# Patient Record
Sex: Female | Born: 1960
Health system: Southern US, Community
[De-identification: ages and names within clinical notes are randomized; demographics above are authoritative.]

## PROBLEM LIST (undated history)

## (undated) DIAGNOSIS — E669 Obesity, unspecified: Secondary | ICD-10-CM

## (undated) DIAGNOSIS — J309 Allergic rhinitis, unspecified: Secondary | ICD-10-CM

## (undated) DIAGNOSIS — E559 Vitamin D deficiency, unspecified: Secondary | ICD-10-CM

## (undated) HISTORY — PX: ABDOMINAL HYSTERECTOMY: SHX81

## (undated) HISTORY — DX: Obesity, unspecified: E66.9

## (undated) HISTORY — DX: Allergic rhinitis, unspecified: J30.9

## (undated) HISTORY — DX: Vitamin D deficiency, unspecified: E55.9

---

## 1997-10-14 ENCOUNTER — Ambulatory Visit (HOSPITAL_COMMUNITY): Admission: RE | Admit: 1997-10-14 | Discharge: 1997-10-14 | Payer: Self-pay | Admitting: Internal Medicine

## 2002-02-09 ENCOUNTER — Encounter: Payer: Self-pay | Admitting: Internal Medicine

## 2002-02-09 ENCOUNTER — Encounter: Admission: RE | Admit: 2002-02-09 | Discharge: 2002-02-09 | Payer: Self-pay | Admitting: Internal Medicine

## 2002-09-21 ENCOUNTER — Encounter (INDEPENDENT_AMBULATORY_CARE_PROVIDER_SITE_OTHER): Payer: Self-pay | Admitting: Cardiovascular Disease

## 2002-09-21 ENCOUNTER — Ambulatory Visit (HOSPITAL_COMMUNITY): Admission: RE | Admit: 2002-09-21 | Discharge: 2002-09-21 | Payer: Self-pay | Admitting: Internal Medicine

## 2003-07-01 ENCOUNTER — Other Ambulatory Visit: Admission: RE | Admit: 2003-07-01 | Discharge: 2003-07-01 | Payer: Self-pay | Admitting: Internal Medicine

## 2005-07-05 ENCOUNTER — Other Ambulatory Visit: Admission: RE | Admit: 2005-07-05 | Discharge: 2005-07-05 | Payer: Self-pay | Admitting: Internal Medicine

## 2006-08-19 ENCOUNTER — Emergency Department (HOSPITAL_COMMUNITY): Admission: EM | Admit: 2006-08-19 | Discharge: 2006-08-19 | Payer: Self-pay | Admitting: Family Medicine

## 2009-06-08 ENCOUNTER — Emergency Department (HOSPITAL_COMMUNITY): Admission: EM | Admit: 2009-06-08 | Discharge: 2009-06-08 | Payer: Self-pay | Admitting: Emergency Medicine

## 2009-07-15 ENCOUNTER — Other Ambulatory Visit: Admission: RE | Admit: 2009-07-15 | Discharge: 2009-07-15 | Payer: Self-pay | Admitting: Internal Medicine

## 2009-07-18 ENCOUNTER — Encounter: Admission: RE | Admit: 2009-07-18 | Discharge: 2009-07-18 | Payer: Self-pay | Admitting: Internal Medicine

## 2010-01-26 ENCOUNTER — Encounter: Payer: Self-pay | Admitting: Internal Medicine

## 2010-06-15 ENCOUNTER — Other Ambulatory Visit: Payer: Self-pay | Admitting: Internal Medicine

## 2010-06-15 DIAGNOSIS — Z1231 Encounter for screening mammogram for malignant neoplasm of breast: Secondary | ICD-10-CM

## 2010-08-11 ENCOUNTER — Ambulatory Visit: Payer: Self-pay

## 2011-04-18 ENCOUNTER — Ambulatory Visit: Payer: BC Managed Care – PPO

## 2011-06-22 ENCOUNTER — Other Ambulatory Visit: Payer: Self-pay | Admitting: Internal Medicine

## 2011-06-22 DIAGNOSIS — Z1231 Encounter for screening mammogram for malignant neoplasm of breast: Secondary | ICD-10-CM

## 2011-07-06 ENCOUNTER — Ambulatory Visit
Admission: RE | Admit: 2011-07-06 | Discharge: 2011-07-06 | Disposition: A | Discharge status: Home / Self Care | Payer: 59 | Source: Ambulatory Visit | Attending: Internal Medicine | Admitting: Internal Medicine

## 2011-07-06 DIAGNOSIS — Z1231 Encounter for screening mammogram for malignant neoplasm of breast: Secondary | ICD-10-CM

## 2011-07-06 LAB — HM MAMMOGRAPHY

## 2011-07-13 LAB — BASIC METABOLIC PANEL
BUN: 16 mg/dL (ref 4–21)
Creatinine: 0.8 mg/dL (ref 0.5–1.1)
Glucose: 88 mg/dL

## 2011-07-13 LAB — HEPATIC FUNCTION PANEL
ALT: 11 U/L (ref 7–35)
AST: 15 U/L (ref 13–35)
Alkaline Phosphatase: 67 U/L (ref 25–125)
Bilirubin, Total: 0.4 mg/dL

## 2011-07-13 LAB — CBC AND DIFFERENTIAL: WBC: 5 10^3/mL

## 2011-07-13 LAB — HEMOGLOBIN A1C: Hgb A1c MFr Bld: 5.8 % (ref 4.0–6.0)

## 2011-07-13 LAB — LIPID PANEL: LDl/HDL Ratio: 3.3

## 2011-07-14 ENCOUNTER — Other Ambulatory Visit (HOSPITAL_COMMUNITY)
Admission: RE | Admit: 2011-07-14 | Discharge: 2011-07-14 | Disposition: A | Discharge status: Home / Self Care | Payer: 59 | Source: Ambulatory Visit | Attending: Internal Medicine | Admitting: Internal Medicine

## 2011-07-14 ENCOUNTER — Other Ambulatory Visit: Payer: Self-pay | Admitting: Internal Medicine

## 2011-07-14 DIAGNOSIS — Z01419 Encounter for gynecological examination (general) (routine) without abnormal findings: Secondary | ICD-10-CM | POA: Insufficient documentation

## 2012-03-08 ENCOUNTER — Encounter: Payer: Self-pay | Admitting: Hematology

## 2013-05-03 ENCOUNTER — Emergency Department (HOSPITAL_COMMUNITY): Payer: PRIVATE HEALTH INSURANCE

## 2013-05-03 ENCOUNTER — Emergency Department (HOSPITAL_COMMUNITY)
Admission: EM | Admit: 2013-05-03 | Discharge: 2013-05-03 | Disposition: A | Discharge status: Home / Self Care | Payer: PRIVATE HEALTH INSURANCE | Attending: Emergency Medicine | Admitting: Emergency Medicine

## 2013-05-03 ENCOUNTER — Encounter (HOSPITAL_COMMUNITY): Payer: Self-pay | Admitting: Emergency Medicine

## 2013-05-03 DIAGNOSIS — Y990 Civilian activity done for income or pay: Secondary | ICD-10-CM | POA: Insufficient documentation

## 2013-05-03 DIAGNOSIS — E669 Obesity, unspecified: Secondary | ICD-10-CM | POA: Insufficient documentation

## 2013-05-03 DIAGNOSIS — W010XXA Fall on same level from slipping, tripping and stumbling without subsequent striking against object, initial encounter: Secondary | ICD-10-CM | POA: Insufficient documentation

## 2013-05-03 DIAGNOSIS — E559 Vitamin D deficiency, unspecified: Secondary | ICD-10-CM | POA: Insufficient documentation

## 2013-05-03 DIAGNOSIS — X500XXA Overexertion from strenuous movement or load, initial encounter: Secondary | ICD-10-CM | POA: Insufficient documentation

## 2013-05-03 DIAGNOSIS — Y9289 Other specified places as the place of occurrence of the external cause: Secondary | ICD-10-CM | POA: Insufficient documentation

## 2013-05-03 DIAGNOSIS — S86912A Strain of unspecified muscle(s) and tendon(s) at lower leg level, left leg, initial encounter: Secondary | ICD-10-CM

## 2013-05-03 DIAGNOSIS — IMO0002 Reserved for concepts with insufficient information to code with codable children: Secondary | ICD-10-CM | POA: Insufficient documentation

## 2013-05-03 DIAGNOSIS — Z79899 Other long term (current) drug therapy: Secondary | ICD-10-CM | POA: Insufficient documentation

## 2013-05-03 DIAGNOSIS — Y9389 Activity, other specified: Secondary | ICD-10-CM | POA: Insufficient documentation

## 2013-05-03 MED ORDER — NAPROXEN 500 MG PO TABS: 500.0000 mg | ORAL_TABLET | Freq: Two times a day (BID) | ORAL | Status: AC

## 2013-05-03 MED ORDER — NAPROXEN 250 MG PO TABS
500.0000 mg | ORAL_TABLET | Freq: Once | ORAL | Status: DC
  Filled 2013-05-03: qty 2

## 2013-05-03 NOTE — ED Notes (Signed)
Patient fell while at at work.  Slipped on the floor thinking there may have been some ice on the floor

## 2013-05-03 NOTE — ED Provider Notes (Signed)
CSN: [id number]     Arrival date & time 05/03/13  0108 History   First MD Initiated Contact with Patient 05/03/13 0252     Chief Complaint  Patient presents with  . Fall     (Consider location/radiation/quality/duration/timing/severity/associated sxs/prior Treatment) HPI Comments: 53 year old female, presents shortly after injuring her left knee when she slipped on a wet floor and fell to the ground. She states that she twisted her knee, felt acute onset of pain which has been persistent, worse with ambulating but not associated with swelling redness or any other injuries.  Patient is a 53 y.o. female presenting with fall. The history is provided by the patient.  Fall    Past Medical History  Diagnosis Date  . Mild obesity   . Allergic rhinitis   . Vitamin D deficiency    Past Surgical History  Procedure Laterality Date  . Abdominal hysterectomy     History reviewed. No pertinent family history. History  Substance Use Topics  . Smoking status: Never Smoker   . Smokeless tobacco: Never Used  . Alcohol Use: No   OB History   Grav Para Term Preterm Abortions TAB SAB Ect Mult Living                 Review of Systems  All other systems reviewed and are negative.     Allergies  Codeine  Home Medications   Prior to Admission medications   Medication Sig Start Date End Date Taking? Authorizing Provider  cholecalciferol (VITAMIN D) 1000 UNITS tablet Take 1,000 Units by mouth daily.    Historical Provider, MD  fish oil-omega-3 fatty acids 1000 MG capsule Take 1 g by mouth daily.    Historical Provider, MD   BP 141/75  Pulse 73  Temp(Src) 98 F (36.7 C) (Oral)  Resp 18  Ht 5\' 4"  (1.626 m)  Wt 160 lb (72.576 kg)  BMI 27.45 kg/m2  SpO2 100% Physical Exam  Nursing note and vitals reviewed. Constitutional: She appears well-developed and well-nourished. No distress.  HENT:  Head: Normocephalic and atraumatic.  Mouth/Throat: Oropharynx is clear and moist. No  oropharyngeal exudate.  Eyes: Conjunctivae and EOM are normal. Pupils are equal, round, and reactive to light. Right eye exhibits no discharge. Left eye exhibits no discharge. No scleral icterus.  Neck: Normal range of motion. Neck supple. No JVD present. No thyromegaly present.  Cardiovascular: Normal rate and intact distal pulses.   Pulmonary/Chest: Effort normal. No respiratory distress.  Musculoskeletal: Normal range of motion. She exhibits tenderness ( Mild tenderness with flexion of the left knee, stable joint anterior posterior lateral and medial, no tenderness over the joint capsule or the proximal tibia, no pain with manipulation of the patella). She exhibits no edema.  Lymphadenopathy:    She has no cervical adenopathy.  Neurological: She is alert. Coordination normal.  Skin: Skin is warm and dry. No rash noted. No erythema.  Psychiatric: She has a normal mood and affect. Her behavior is normal.    ED Course  Procedures (including critical care time) Labs Review Labs Reviewed - No data to display  Imaging Review Dg Knee Complete 4 Views Left  05/03/2013   CLINICAL DATA:  Pain and swelling anteriorly after following and striking the left knee  EXAM: LEFT KNEE - COMPLETE 4+ VIEW  COMPARISON:  None.  FINDINGS: Four views of the left knee reveal the bones to be adequately mineralized. There is no evidence of an acute fracture nor dislocation. There is minimal beaking  of the tibial spines. There may be a small joint effusion. There is soft tissue fullness in the prepatellar region.  IMPRESSION: There is no acute bony abnormality of the left knee. There is mild soft tissue swelling and likely a small joint effusion.   Electronically Signed   By: David  [place]   On: 05/03/2013 01:59      MDM   Final diagnoses:  None    The patient has normal sensation, normal motor, she can straight leg raise without difficulty but has pain with flexion, x-rays reviewed and show no signs of  fracture, knee immobilizer, Rice therapy, home.   Meds given in ED:  Medications  naproxen (NAPROSYN) tablet 500 mg (not administered)    New Prescriptions   NAPROXEN (NAPROSYN) 500 MG TABLET    Take 1 tablet (500 mg total) by mouth 2 (two) times daily with a meal.        Vida RollerBrian D Daran Favaro, MD 05/03/13 [phone]

## 2013-05-03 NOTE — Discharge Instructions (Signed)
Please call your doctor for a followup appointment within 24-48 hours. When you talk to your doctor please let them know that you were seen in the emergency department and have them acquire all of your records so that they can discuss the findings with you and formulate a treatment plan to fully care for your new and ongoing problems.  xrays show no fractures

## 2013-07-24 ENCOUNTER — Other Ambulatory Visit: Payer: Self-pay

## 2013-07-24 DIAGNOSIS — Z1231 Encounter for screening mammogram for malignant neoplasm of breast: Secondary | ICD-10-CM

## 2013-07-26 ENCOUNTER — Ambulatory Visit
Admission: RE | Admit: 2013-07-26 | Discharge: 2013-07-26 | Disposition: A | Discharge status: Home / Self Care | Payer: 59 | Source: Ambulatory Visit

## 2013-07-26 DIAGNOSIS — Z1231 Encounter for screening mammogram for malignant neoplasm of breast: Secondary | ICD-10-CM

## 2013-08-02 ENCOUNTER — Ambulatory Visit: Payer: Self-pay

## 2013-11-10 ENCOUNTER — Encounter: Payer: 59 | Attending: Internal Medicine | Admitting: Skilled Nursing Facility1

## 2013-11-10 DIAGNOSIS — E669 Obesity, unspecified: Secondary | ICD-10-CM | POA: Insufficient documentation

## 2013-11-10 DIAGNOSIS — Z713 Dietary counseling and surveillance: Secondary | ICD-10-CM | POA: Insufficient documentation

## 2013-11-10 DIAGNOSIS — Z6827 Body mass index (BMI) 27.0-27.9, adult: Secondary | ICD-10-CM | POA: Diagnosis not present

## 2013-11-12 NOTE — Progress Notes (Deleted)
Subjective:     Patient ID: Amy Fields, female   DOB: 01/23/1960, 53 y.o.   MRN: [id number]  HPI   Review of Systems     Objective:   Physical Exam     Assessment:     ***    Plan:     ***      

## 2013-11-15 NOTE — Progress Notes (Deleted)
Subjective:     Patient ID: Amy Fields, female   DOB: 01/07/1960, 53 y.o.   MRN: [id number]  HPI   Review of Systems     Objective:   Physical Exam     Assessment:     ***    Plan:     ***

## 2013-11-15 NOTE — Progress Notes (Signed)
Patient was seen on 11/10/13 for the Weight Loss Class at the [facility]. The following learning objectives were met by the patient during this class:   Describe healthy choices in each food group  Describe portion size of foods  Use plate method for meal planning  Demonstrate how to read Nutrition Facts food label  Set realistic goals for weight loss, diet changes, and physical activity.   Goals:  1. Make healthy food choices in each food group.  2. Reduce portion size of foods.  3. Increase fruit and vegetable intake.  4. Use plate method for meal planning.  5. Increase physical activity.    Handouts given:   1. Nutrition Strategies for Weight Loss   2. Meal plan/portion card   3. MyPlate Planner   4. Weight Management Recipe Resources   5. Bake, [place]il, [place]

## 2014-01-11 ENCOUNTER — Ambulatory Visit: Payer: 59 | Admitting: Skilled Nursing Facility1

## 2014-07-11 ENCOUNTER — Other Ambulatory Visit: Payer: Self-pay

## 2014-07-11 DIAGNOSIS — Z1231 Encounter for screening mammogram for malignant neoplasm of breast: Secondary | ICD-10-CM

## 2014-08-13 ENCOUNTER — Ambulatory Visit
Admission: RE | Admit: 2014-08-13 | Discharge: 2014-08-13 | Disposition: A | Discharge status: Home / Self Care | Payer: 59 | Source: Ambulatory Visit

## 2014-08-13 DIAGNOSIS — Z1231 Encounter for screening mammogram for malignant neoplasm of breast: Secondary | ICD-10-CM

## 2015-02-08 ENCOUNTER — Encounter: Payer: 59 | Attending: Internal Medicine | Admitting: Dietician

## 2015-02-08 DIAGNOSIS — Z713 Dietary counseling and surveillance: Secondary | ICD-10-CM | POA: Insufficient documentation

## 2015-02-08 NOTE — Progress Notes (Signed)
Patient was seen on 02/08/15 for the Weight Loss Class at the [facility]. The following learning objectives were met by the patient during this class:   Describe healthy choices in each food group  Describe portion size of foods  Use plate method for meal planning  Demonstrate how to read Nutrition Facts food label  Set realistic goals for weight loss, diet changes, and physical activity.   Goals:  1. Make healthy food choices in each food group.  2. Reduce portion size of foods.  3. Increase fruit and vegetable intake.  4. Use plate method for meal planning.  5. Increase physical activity.    Handouts given:   1. Nutrition Strategies for Weight Loss   2. Meal plan/portion card   3. MyPlate Planner   4. Weight Management Recipe Resources   5. Bake, Broil, [place]   

## 2015-03-31 DIAGNOSIS — M25562 Pain in left knee: Secondary | ICD-10-CM | POA: Diagnosis not present

## 2015-03-31 DIAGNOSIS — M170 Bilateral primary osteoarthritis of knee: Secondary | ICD-10-CM | POA: Diagnosis not present

## 2015-03-31 DIAGNOSIS — M25561 Pain in right knee: Secondary | ICD-10-CM | POA: Diagnosis not present

## 2015-04-07 DIAGNOSIS — M25561 Pain in right knee: Secondary | ICD-10-CM | POA: Diagnosis not present

## 2015-04-07 DIAGNOSIS — M170 Bilateral primary osteoarthritis of knee: Secondary | ICD-10-CM | POA: Diagnosis not present

## 2015-04-07 DIAGNOSIS — M25562 Pain in left knee: Secondary | ICD-10-CM | POA: Diagnosis not present

## 2015-04-10 DIAGNOSIS — M25561 Pain in right knee: Secondary | ICD-10-CM | POA: Diagnosis not present

## 2015-04-10 DIAGNOSIS — M1711 Unilateral primary osteoarthritis, right knee: Secondary | ICD-10-CM | POA: Diagnosis not present

## 2015-04-10 DIAGNOSIS — R2689 Other abnormalities of gait and mobility: Secondary | ICD-10-CM | POA: Diagnosis not present

## 2015-04-14 DIAGNOSIS — M25562 Pain in left knee: Secondary | ICD-10-CM | POA: Diagnosis not present

## 2015-04-14 DIAGNOSIS — M170 Bilateral primary osteoarthritis of knee: Secondary | ICD-10-CM | POA: Diagnosis not present

## 2015-04-14 DIAGNOSIS — M25561 Pain in right knee: Secondary | ICD-10-CM | POA: Diagnosis not present

## 2015-04-23 DIAGNOSIS — M170 Bilateral primary osteoarthritis of knee: Secondary | ICD-10-CM | POA: Diagnosis not present

## 2015-04-23 DIAGNOSIS — M25562 Pain in left knee: Secondary | ICD-10-CM | POA: Diagnosis not present

## 2015-04-23 DIAGNOSIS — M25561 Pain in right knee: Secondary | ICD-10-CM | POA: Diagnosis not present

## 2015-04-28 DIAGNOSIS — M25562 Pain in left knee: Secondary | ICD-10-CM | POA: Diagnosis not present

## 2015-04-28 DIAGNOSIS — M170 Bilateral primary osteoarthritis of knee: Secondary | ICD-10-CM | POA: Diagnosis not present

## 2015-04-28 DIAGNOSIS — M25561 Pain in right knee: Secondary | ICD-10-CM | POA: Diagnosis not present

## 2015-07-23 ENCOUNTER — Other Ambulatory Visit: Payer: Self-pay | Admitting: Internal Medicine

## 2015-07-23 DIAGNOSIS — Z1231 Encounter for screening mammogram for malignant neoplasm of breast: Secondary | ICD-10-CM

## 2015-08-18 ENCOUNTER — Ambulatory Visit: Payer: 59

## 2015-08-20 ENCOUNTER — Ambulatory Visit
Admission: RE | Admit: 2015-08-20 | Discharge: 2015-08-20 | Disposition: A | Discharge status: Home / Self Care | Payer: 59 | Source: Ambulatory Visit | Attending: Internal Medicine | Admitting: Internal Medicine

## 2015-08-20 DIAGNOSIS — Z1231 Encounter for screening mammogram for malignant neoplasm of breast: Secondary | ICD-10-CM | POA: Diagnosis not present

## 2015-09-11 DIAGNOSIS — Z1151 Encounter for screening for human papillomavirus (HPV): Secondary | ICD-10-CM | POA: Diagnosis not present

## 2015-09-29 DIAGNOSIS — E559 Vitamin D deficiency, unspecified: Secondary | ICD-10-CM | POA: Diagnosis not present

## 2015-09-29 DIAGNOSIS — Z0000 Encounter for general adult medical examination without abnormal findings: Secondary | ICD-10-CM | POA: Diagnosis not present

## 2016-09-01 ENCOUNTER — Other Ambulatory Visit: Payer: Self-pay | Admitting: Internal Medicine

## 2016-09-01 DIAGNOSIS — Z1231 Encounter for screening mammogram for malignant neoplasm of breast: Secondary | ICD-10-CM

## 2016-09-10 ENCOUNTER — Ambulatory Visit
Admission: RE | Admit: 2016-09-10 | Discharge: 2016-09-10 | Disposition: A | Discharge status: Home / Self Care | Payer: 59 | Source: Ambulatory Visit | Attending: Internal Medicine | Admitting: Internal Medicine

## 2016-09-10 DIAGNOSIS — Z1231 Encounter for screening mammogram for malignant neoplasm of breast: Secondary | ICD-10-CM

## 2016-12-03 DIAGNOSIS — H524 Presbyopia: Secondary | ICD-10-CM | POA: Diagnosis not present

## 2017-03-22 DIAGNOSIS — Z6833 Body mass index (BMI) 33.0-33.9, adult: Secondary | ICD-10-CM | POA: Diagnosis not present

## 2017-03-22 DIAGNOSIS — Z136 Encounter for screening for cardiovascular disorders: Secondary | ICD-10-CM | POA: Diagnosis not present

## 2017-03-22 DIAGNOSIS — E669 Obesity, unspecified: Secondary | ICD-10-CM | POA: Diagnosis not present

## 2017-03-22 DIAGNOSIS — Z131 Encounter for screening for diabetes mellitus: Secondary | ICD-10-CM | POA: Diagnosis not present

## 2017-03-22 DIAGNOSIS — E559 Vitamin D deficiency, unspecified: Secondary | ICD-10-CM | POA: Diagnosis not present

## 2017-03-22 DIAGNOSIS — I10 Essential (primary) hypertension: Secondary | ICD-10-CM | POA: Diagnosis not present

## 2017-03-22 DIAGNOSIS — Z124 Encounter for screening for malignant neoplasm of cervix: Secondary | ICD-10-CM | POA: Diagnosis not present

## 2017-03-22 DIAGNOSIS — Z13220 Encounter for screening for lipoid disorders: Secondary | ICD-10-CM | POA: Diagnosis not present

## 2017-06-16 ENCOUNTER — Encounter: Payer: Self-pay | Admitting: Skilled Nursing Facility1

## 2017-06-16 ENCOUNTER — Encounter: Payer: 59 | Attending: Internal Medicine | Admitting: Skilled Nursing Facility1

## 2017-06-16 DIAGNOSIS — Z713 Dietary counseling and surveillance: Secondary | ICD-10-CM | POA: Diagnosis not present

## 2017-06-16 DIAGNOSIS — E639 Nutritional deficiency, unspecified: Secondary | ICD-10-CM | POA: Diagnosis not present

## 2017-06-16 NOTE — Progress Notes (Signed)
  Assessment:  Primary concerns today: self referral.   Pt states she was only here to get a blood pressure reading and weight taken. Dietitian took the pts weight and advised the pt the deititian does not take a blood pressure.   Dietitian educated the pt on the need for carbohydrates and to fuel her workouts properly to aim for 2 servings of carbohydrate per meal.

## 2017-08-30 ENCOUNTER — Other Ambulatory Visit: Payer: Self-pay | Admitting: Internal Medicine

## 2017-08-30 DIAGNOSIS — Z1231 Encounter for screening mammogram for malignant neoplasm of breast: Secondary | ICD-10-CM

## 2017-09-29 ENCOUNTER — Ambulatory Visit
Admission: RE | Admit: 2017-09-29 | Discharge: 2017-09-29 | Disposition: A | Discharge status: Home / Self Care | Payer: 59 | Source: Ambulatory Visit | Attending: Internal Medicine | Admitting: Internal Medicine

## 2017-09-29 DIAGNOSIS — Z1231 Encounter for screening mammogram for malignant neoplasm of breast: Secondary | ICD-10-CM | POA: Diagnosis not present

## 2018-02-21 DIAGNOSIS — H524 Presbyopia: Secondary | ICD-10-CM | POA: Diagnosis not present

## 2018-02-21 DIAGNOSIS — H5213 Myopia, bilateral: Secondary | ICD-10-CM | POA: Diagnosis not present

## 2018-02-21 DIAGNOSIS — H52223 Regular astigmatism, bilateral: Secondary | ICD-10-CM | POA: Diagnosis not present

## 2018-03-14 DIAGNOSIS — Z0001 Encounter for general adult medical examination with abnormal findings: Secondary | ICD-10-CM | POA: Diagnosis not present

## 2018-03-14 DIAGNOSIS — N765 Ulceration of vagina: Secondary | ICD-10-CM | POA: Diagnosis not present

## 2018-03-14 DIAGNOSIS — E559 Vitamin D deficiency, unspecified: Secondary | ICD-10-CM | POA: Diagnosis not present

## 2018-03-14 DIAGNOSIS — Z6832 Body mass index (BMI) 32.0-32.9, adult: Secondary | ICD-10-CM | POA: Diagnosis not present

## 2018-08-21 ENCOUNTER — Other Ambulatory Visit: Payer: Self-pay | Admitting: Internal Medicine

## 2018-08-21 DIAGNOSIS — Z1231 Encounter for screening mammogram for malignant neoplasm of breast: Secondary | ICD-10-CM

## 2018-09-27 DIAGNOSIS — M1712 Unilateral primary osteoarthritis, left knee: Secondary | ICD-10-CM | POA: Diagnosis not present

## 2018-09-27 DIAGNOSIS — M25562 Pain in left knee: Secondary | ICD-10-CM | POA: Diagnosis not present

## 2018-10-05 ENCOUNTER — Ambulatory Visit: Payer: 59

## 2018-11-15 ENCOUNTER — Other Ambulatory Visit: Payer: Self-pay

## 2018-11-15 ENCOUNTER — Ambulatory Visit
Admission: RE | Admit: 2018-11-15 | Discharge: 2018-11-15 | Disposition: A | Discharge status: Home / Self Care | Payer: 59 | Source: Ambulatory Visit | Attending: Internal Medicine | Admitting: Internal Medicine

## 2018-11-15 DIAGNOSIS — Z1231 Encounter for screening mammogram for malignant neoplasm of breast: Secondary | ICD-10-CM

## 2018-11-17 DIAGNOSIS — M25562 Pain in left knee: Secondary | ICD-10-CM | POA: Diagnosis not present

## 2018-11-17 DIAGNOSIS — M1712 Unilateral primary osteoarthritis, left knee: Secondary | ICD-10-CM | POA: Diagnosis not present

## 2019-03-27 DIAGNOSIS — J301 Allergic rhinitis due to pollen: Secondary | ICD-10-CM | POA: Diagnosis not present

## 2019-03-27 DIAGNOSIS — E669 Obesity, unspecified: Secondary | ICD-10-CM | POA: Diagnosis not present

## 2019-03-27 DIAGNOSIS — Z833 Family history of diabetes mellitus: Secondary | ICD-10-CM | POA: Diagnosis not present

## 2019-03-27 DIAGNOSIS — Z0000 Encounter for general adult medical examination without abnormal findings: Secondary | ICD-10-CM | POA: Diagnosis not present

## 2019-07-30 ENCOUNTER — Other Ambulatory Visit: Payer: Self-pay | Admitting: Internal Medicine

## 2019-07-30 DIAGNOSIS — Z1231 Encounter for screening mammogram for malignant neoplasm of breast: Secondary | ICD-10-CM

## 2019-11-22 ENCOUNTER — Other Ambulatory Visit: Payer: Self-pay

## 2019-11-22 ENCOUNTER — Ambulatory Visit
Admission: RE | Admit: 2019-11-22 | Discharge: 2019-11-22 | Disposition: A | Discharge status: Home / Self Care | Payer: 59 | Source: Ambulatory Visit | Attending: Internal Medicine | Admitting: Internal Medicine

## 2019-11-22 DIAGNOSIS — Z1231 Encounter for screening mammogram for malignant neoplasm of breast: Secondary | ICD-10-CM | POA: Diagnosis not present

## 2019-11-28 ENCOUNTER — Other Ambulatory Visit: Payer: Self-pay | Admitting: Internal Medicine

## 2019-11-28 DIAGNOSIS — R928 Other abnormal and inconclusive findings on diagnostic imaging of breast: Secondary | ICD-10-CM

## 2019-12-14 ENCOUNTER — Ambulatory Visit: Payer: 59

## 2019-12-14 ENCOUNTER — Other Ambulatory Visit: Payer: Self-pay

## 2019-12-14 ENCOUNTER — Ambulatory Visit
Admission: RE | Admit: 2019-12-14 | Discharge: 2019-12-14 | Disposition: A | Discharge status: Home / Self Care | Payer: 59 | Source: Ambulatory Visit | Attending: Internal Medicine | Admitting: Internal Medicine

## 2019-12-14 DIAGNOSIS — R922 Inconclusive mammogram: Secondary | ICD-10-CM | POA: Diagnosis not present

## 2019-12-14 DIAGNOSIS — R928 Other abnormal and inconclusive findings on diagnostic imaging of breast: Secondary | ICD-10-CM

## 2020-03-06 ENCOUNTER — Other Ambulatory Visit (HOSPITAL_COMMUNITY): Payer: Self-pay | Admitting: Internal Medicine

## 2020-03-06 MED FILL — OMRON 3 SERIES BP MONITOR D: 1 days supply | Qty: 1 | Fill #0

## 2020-04-21 DIAGNOSIS — Z833 Family history of diabetes mellitus: Secondary | ICD-10-CM | POA: Diagnosis not present

## 2020-04-21 DIAGNOSIS — Z8616 Personal history of COVID-19: Secondary | ICD-10-CM | POA: Diagnosis not present

## 2020-04-21 DIAGNOSIS — Z0000 Encounter for general adult medical examination without abnormal findings: Secondary | ICD-10-CM | POA: Diagnosis not present

## 2020-04-21 DIAGNOSIS — Z6831 Body mass index (BMI) 31.0-31.9, adult: Secondary | ICD-10-CM | POA: Diagnosis not present

## 2020-04-21 DIAGNOSIS — M79643 Pain in unspecified hand: Secondary | ICD-10-CM | POA: Diagnosis not present

## 2020-04-21 DIAGNOSIS — E669 Obesity, unspecified: Secondary | ICD-10-CM | POA: Diagnosis not present

## 2020-04-21 DIAGNOSIS — Z13220 Encounter for screening for lipoid disorders: Secondary | ICD-10-CM | POA: Diagnosis not present

## 2020-04-21 DIAGNOSIS — Z124 Encounter for screening for malignant neoplasm of cervix: Secondary | ICD-10-CM | POA: Diagnosis not present

## 2020-04-21 DIAGNOSIS — J301 Allergic rhinitis due to pollen: Secondary | ICD-10-CM | POA: Diagnosis not present

## 2020-04-21 DIAGNOSIS — Z130 Encounter for screening for diseases of the blood and blood-forming organs and certain disorders involving the immune mechanism: Secondary | ICD-10-CM | POA: Diagnosis not present

## 2020-04-21 DIAGNOSIS — Z139 Encounter for screening, unspecified: Secondary | ICD-10-CM | POA: Diagnosis not present

## 2020-10-17 ENCOUNTER — Other Ambulatory Visit: Payer: Self-pay | Admitting: Internal Medicine

## 2020-10-17 DIAGNOSIS — Z1231 Encounter for screening mammogram for malignant neoplasm of breast: Secondary | ICD-10-CM

## 2020-11-21 ENCOUNTER — Other Ambulatory Visit (HOSPITAL_COMMUNITY): Payer: Self-pay

## 2020-11-21 MED ORDER — AZITHROMYCIN 250 MG PO TABS
ORAL_TABLET | ORAL | 0 refills | Status: AC
  Filled 2020-11-21: qty 6, 5d supply, fill #0

## 2020-11-21 MED ORDER — CARESTART COVID-19 HOME TEST VI KIT
PACK | 0 refills | Status: AC
  Filled 2020-11-21: qty 4, 4d supply, fill #0

## 2020-11-25 ENCOUNTER — Ambulatory Visit
Admission: RE | Admit: 2020-11-25 | Discharge: 2020-11-25 | Disposition: A | Discharge status: Home / Self Care | Payer: 59 | Source: Ambulatory Visit | Attending: Internal Medicine | Admitting: Internal Medicine

## 2020-11-25 DIAGNOSIS — Z1231 Encounter for screening mammogram for malignant neoplasm of breast: Secondary | ICD-10-CM | POA: Diagnosis not present

## 2021-06-17 DIAGNOSIS — Z6832 Body mass index (BMI) 32.0-32.9, adult: Secondary | ICD-10-CM | POA: Diagnosis not present

## 2021-06-17 DIAGNOSIS — J301 Allergic rhinitis due to pollen: Secondary | ICD-10-CM | POA: Diagnosis not present

## 2021-06-17 DIAGNOSIS — Z8616 Personal history of COVID-19: Secondary | ICD-10-CM | POA: Diagnosis not present

## 2021-06-17 DIAGNOSIS — Z0000 Encounter for general adult medical examination without abnormal findings: Secondary | ICD-10-CM | POA: Diagnosis not present

## 2021-06-17 DIAGNOSIS — Z833 Family history of diabetes mellitus: Secondary | ICD-10-CM | POA: Diagnosis not present

## 2021-06-17 DIAGNOSIS — Z113 Encounter for screening for infections with a predominantly sexual mode of transmission: Secondary | ICD-10-CM | POA: Diagnosis not present

## 2021-06-18 ENCOUNTER — Other Ambulatory Visit (HOSPITAL_COMMUNITY): Payer: Self-pay

## 2021-06-18 DIAGNOSIS — J301 Allergic rhinitis due to pollen: Secondary | ICD-10-CM | POA: Diagnosis not present

## 2021-06-18 DIAGNOSIS — Z0000 Encounter for general adult medical examination without abnormal findings: Secondary | ICD-10-CM | POA: Diagnosis not present

## 2021-06-18 DIAGNOSIS — Z8616 Personal history of COVID-19: Secondary | ICD-10-CM | POA: Diagnosis not present

## 2021-06-18 DIAGNOSIS — Z113 Encounter for screening for infections with a predominantly sexual mode of transmission: Secondary | ICD-10-CM | POA: Diagnosis not present

## 2021-06-18 DIAGNOSIS — Z6832 Body mass index (BMI) 32.0-32.9, adult: Secondary | ICD-10-CM | POA: Diagnosis not present

## 2021-06-18 DIAGNOSIS — Z833 Family history of diabetes mellitus: Secondary | ICD-10-CM | POA: Diagnosis not present

## 2021-06-18 MED ORDER — AZELASTINE HCL 0.15 % NA SOLN
2.0000 | Freq: Every day | NASAL | 11 refills | Status: AC
  Filled 2021-06-18: qty 30, 90d supply, fill #0

## 2021-06-19 ENCOUNTER — Other Ambulatory Visit (HOSPITAL_COMMUNITY): Payer: Self-pay

## 2021-07-23 DIAGNOSIS — Z1212 Encounter for screening for malignant neoplasm of rectum: Secondary | ICD-10-CM | POA: Diagnosis not present

## 2021-07-23 DIAGNOSIS — Z1211 Encounter for screening for malignant neoplasm of colon: Secondary | ICD-10-CM | POA: Diagnosis not present

## 2021-10-14 ENCOUNTER — Other Ambulatory Visit: Payer: Self-pay | Admitting: Internal Medicine

## 2021-10-14 DIAGNOSIS — Z1231 Encounter for screening mammogram for malignant neoplasm of breast: Secondary | ICD-10-CM

## 2021-12-02 ENCOUNTER — Ambulatory Visit
Admission: RE | Admit: 2021-12-02 | Discharge: 2021-12-02 | Disposition: A | Discharge status: Home / Self Care | Payer: 59 | Source: Ambulatory Visit | Attending: Internal Medicine | Admitting: Internal Medicine

## 2021-12-02 DIAGNOSIS — Z1231 Encounter for screening mammogram for malignant neoplasm of breast: Secondary | ICD-10-CM | POA: Diagnosis not present

## 2022-06-24 DIAGNOSIS — Z0001 Encounter for general adult medical examination with abnormal findings: Secondary | ICD-10-CM | POA: Diagnosis not present

## 2022-06-24 DIAGNOSIS — Z6832 Body mass index (BMI) 32.0-32.9, adult: Secondary | ICD-10-CM | POA: Diagnosis not present

## 2022-06-24 DIAGNOSIS — Z8349 Family history of other endocrine, nutritional and metabolic diseases: Secondary | ICD-10-CM | POA: Diagnosis not present

## 2022-06-24 DIAGNOSIS — Z8616 Personal history of COVID-19: Secondary | ICD-10-CM | POA: Diagnosis not present

## 2022-06-24 DIAGNOSIS — J301 Allergic rhinitis due to pollen: Secondary | ICD-10-CM | POA: Diagnosis not present

## 2022-06-30 IMAGING — MG MM DIGITAL SCREENING BILAT W/ TOMO AND CAD
6 of 10 series · 6 of 30 positions shown · non-contrast
Comparison: Previous exam(s).

CLINICAL DATA: Screening.

EXAM:
DIGITAL SCREENING BILATERAL MAMMOGRAM WITH TOMOSYNTHESIS AND CAD
TECHNIQUE: Bilateral screening digital craniocaudal and mediolateral oblique
mammograms were obtained. Bilateral screening digital breast
tomosynthesis was performed. The images were evaluated with
computer-aided detection.

[R MLO synth-2D]
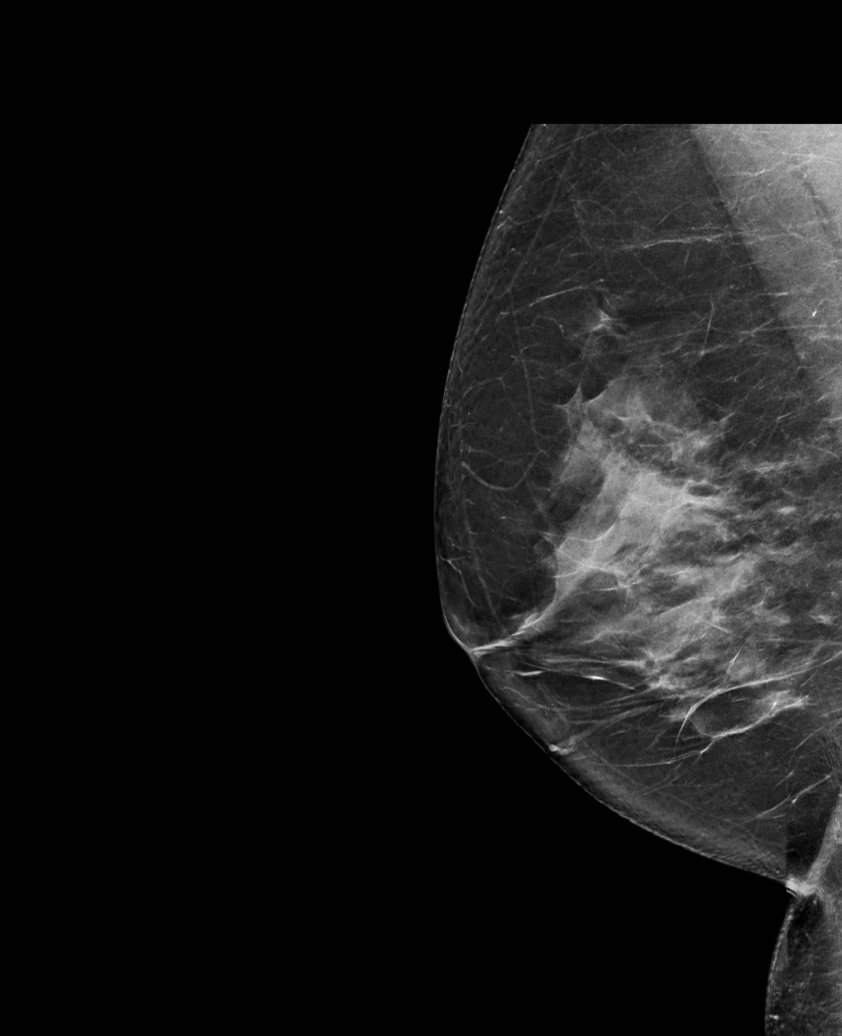

[L CC synth-2D (1 of 2)]
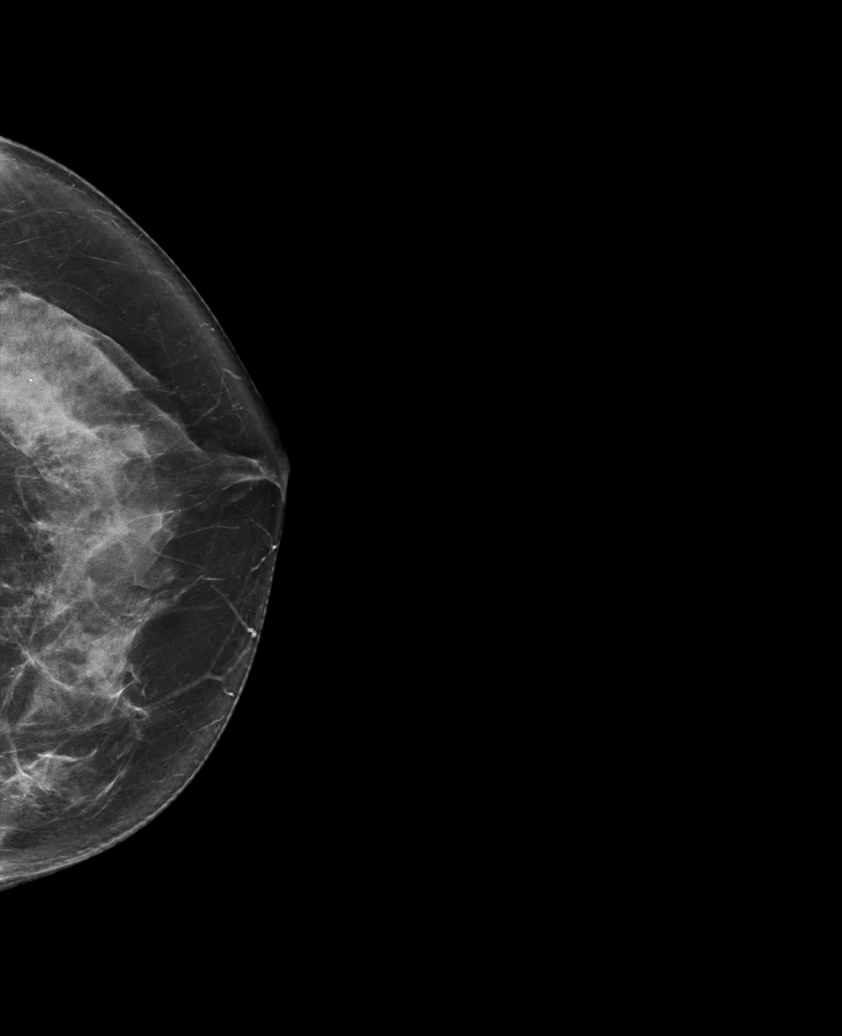

[R CC synth-2D]
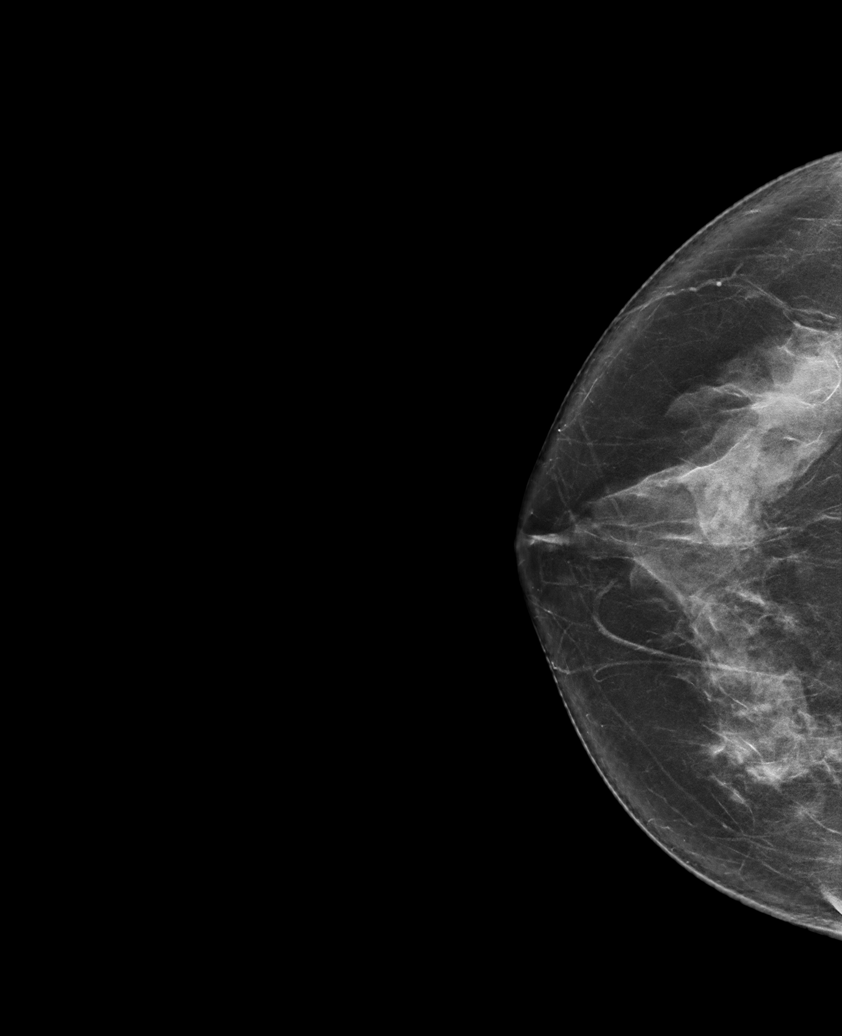

[L MLO synth-2D]
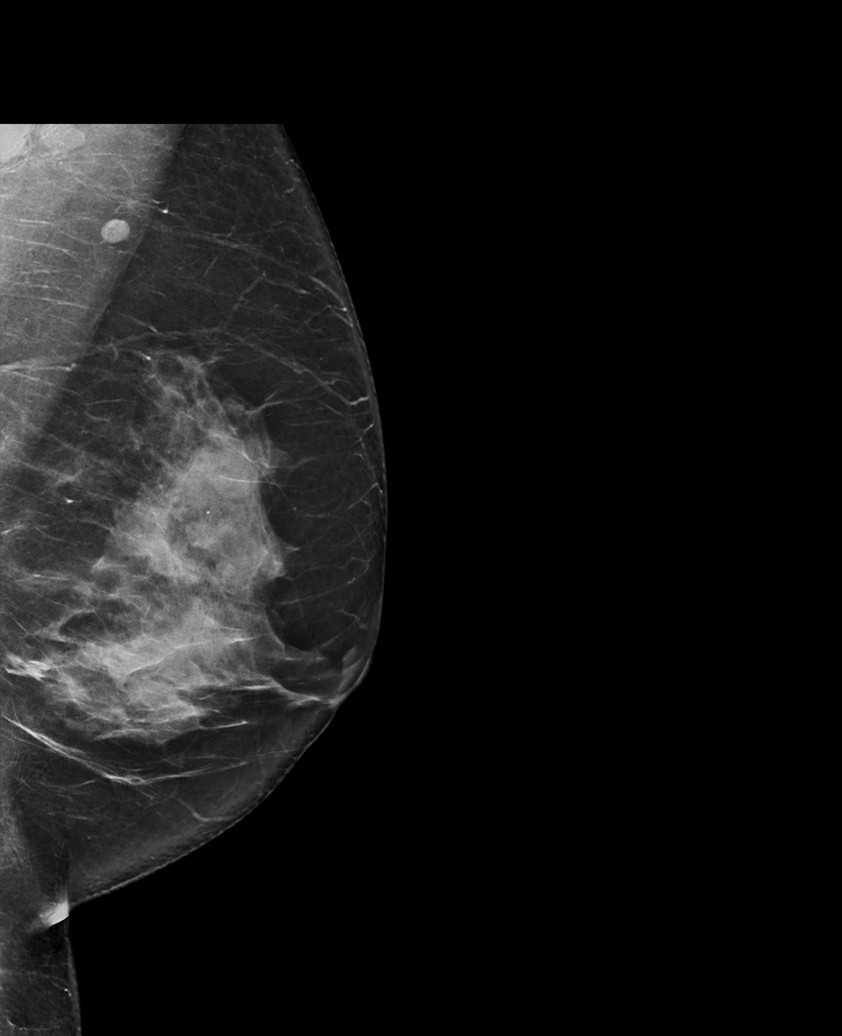

[L CC synth-2D (2 of 2)]
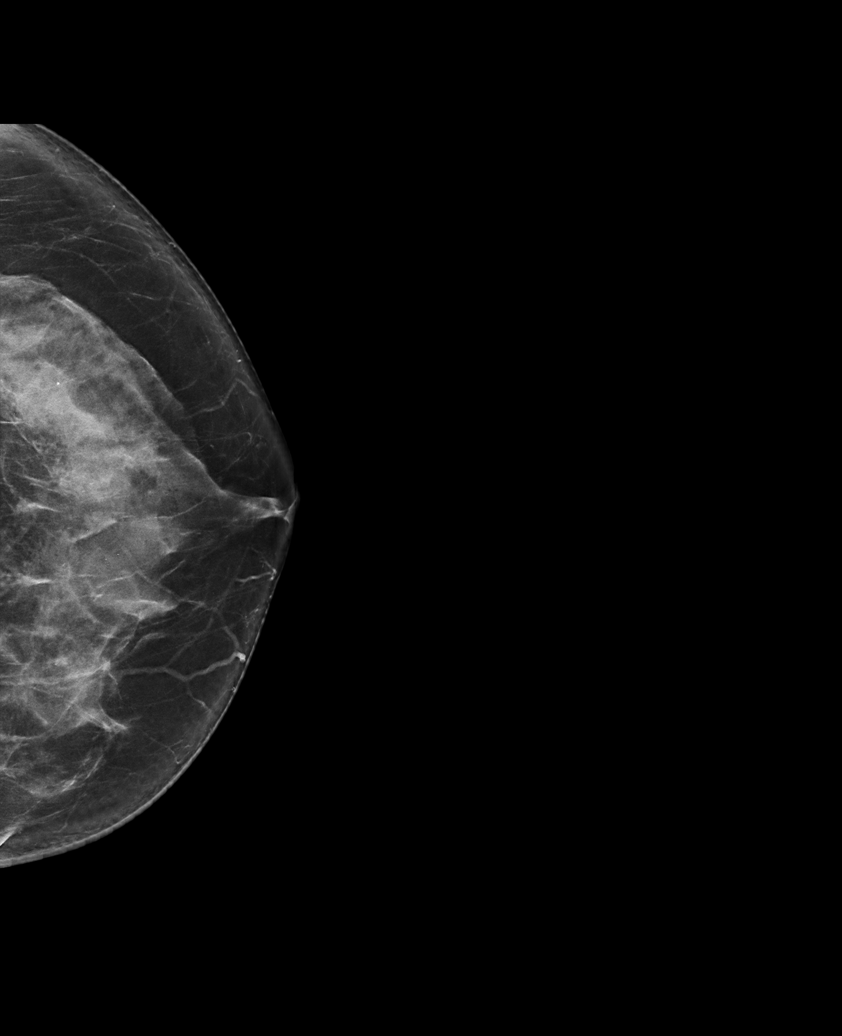

[L MLO tomo · tomo slice 45/90.0]
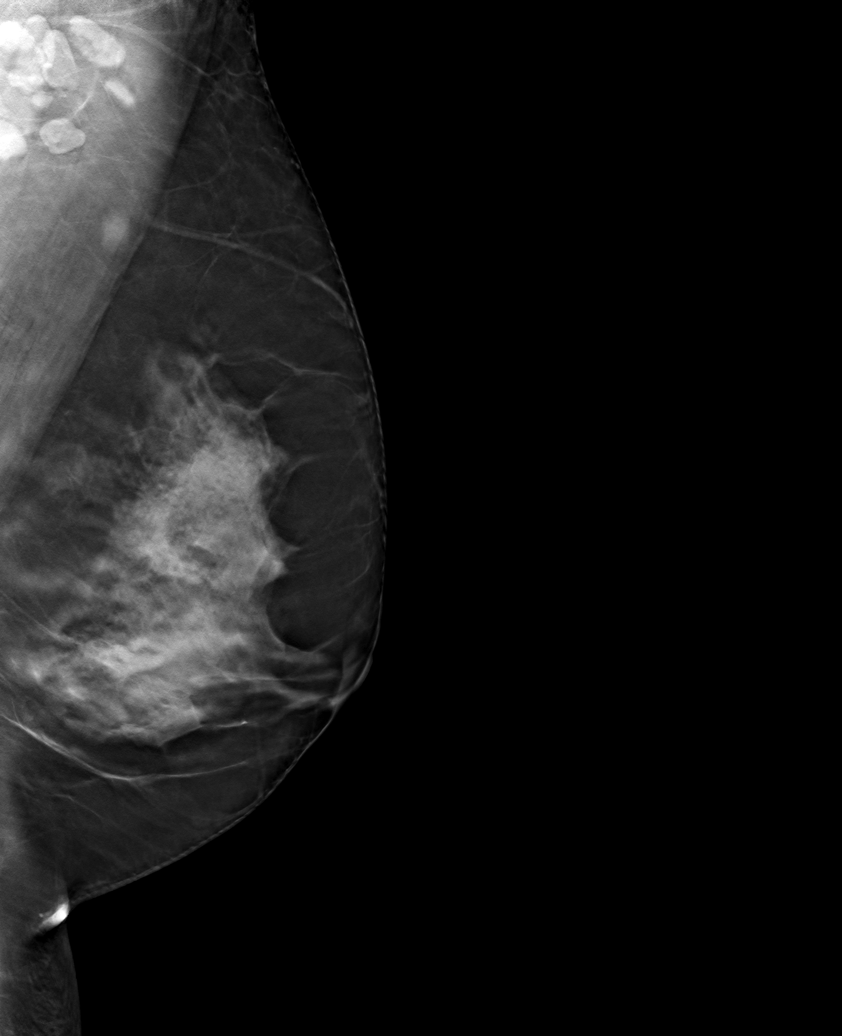

[6 of 30 positions shown; findings below may reference images not displayed]

ACR Breast Density Category c: The breast tissue is heterogeneously
dense, which may obscure small masses.
FINDINGS: There are no findings suspicious for malignancy.
IMPRESSION: No mammographic evidence of malignancy. A result letter of this
screening mammogram will be mailed directly to the patient.

RECOMMENDATION:
Screening mammogram in one year. (Code:Q3-W-BC3)

BI-RADS CATEGORY  1: Negative.

## 2022-09-07 ENCOUNTER — Other Ambulatory Visit: Payer: Self-pay | Admitting: Internal Medicine

## 2022-09-07 DIAGNOSIS — Z1231 Encounter for screening mammogram for malignant neoplasm of breast: Secondary | ICD-10-CM

## 2022-11-12 ENCOUNTER — Other Ambulatory Visit (HOSPITAL_COMMUNITY): Payer: Self-pay

## 2022-11-12 MED ORDER — AZITHROMYCIN 250 MG PO TABS
ORAL_TABLET | ORAL | 0 refills | Status: AC
  Filled 2022-11-12: qty 6, 5d supply, fill #0

## 2022-12-06 ENCOUNTER — Ambulatory Visit
Admission: RE | Admit: 2022-12-06 | Discharge: 2022-12-06 | Disposition: A | Discharge status: Home / Self Care | Payer: 59 | Source: Ambulatory Visit | Attending: Internal Medicine | Admitting: Internal Medicine

## 2022-12-06 DIAGNOSIS — Z1231 Encounter for screening mammogram for malignant neoplasm of breast: Secondary | ICD-10-CM

## 2022-12-08 ENCOUNTER — Ambulatory Visit: Payer: Commercial Managed Care - PPO

## 2022-12-20 ENCOUNTER — Other Ambulatory Visit (HOSPITAL_COMMUNITY): Payer: Self-pay

## 2022-12-20 MED ORDER — AZITHROMYCIN 250 MG PO TABS
ORAL_TABLET | ORAL | 0 refills | Status: AC
  Filled 2022-12-20: qty 6, 5d supply, fill #0

## 2022-12-21 ENCOUNTER — Other Ambulatory Visit (HOSPITAL_COMMUNITY): Payer: Self-pay

## 2023-06-24 ENCOUNTER — Other Ambulatory Visit (HOSPITAL_COMMUNITY): Payer: Self-pay

## 2023-06-24 DIAGNOSIS — J301 Allergic rhinitis due to pollen: Secondary | ICD-10-CM | POA: Diagnosis not present

## 2023-06-24 DIAGNOSIS — Z0001 Encounter for general adult medical examination with abnormal findings: Secondary | ICD-10-CM | POA: Diagnosis not present

## 2023-06-24 DIAGNOSIS — Z8616 Personal history of COVID-19: Secondary | ICD-10-CM | POA: Diagnosis not present

## 2023-06-24 DIAGNOSIS — R8290 Unspecified abnormal findings in urine: Secondary | ICD-10-CM | POA: Diagnosis not present

## 2023-06-24 DIAGNOSIS — E559 Vitamin D deficiency, unspecified: Secondary | ICD-10-CM | POA: Diagnosis not present

## 2023-06-24 DIAGNOSIS — J0111 Acute recurrent frontal sinusitis: Secondary | ICD-10-CM | POA: Diagnosis not present

## 2023-06-24 DIAGNOSIS — Z8349 Family history of other endocrine, nutritional and metabolic diseases: Secondary | ICD-10-CM | POA: Diagnosis not present

## 2023-06-24 DIAGNOSIS — Z6833 Body mass index (BMI) 33.0-33.9, adult: Secondary | ICD-10-CM | POA: Diagnosis not present

## 2023-06-24 DIAGNOSIS — I119 Hypertensive heart disease without heart failure: Secondary | ICD-10-CM | POA: Diagnosis not present

## 2023-06-24 DIAGNOSIS — J321 Chronic frontal sinusitis: Secondary | ICD-10-CM | POA: Diagnosis not present

## 2023-06-24 DIAGNOSIS — E785 Hyperlipidemia, unspecified: Secondary | ICD-10-CM | POA: Diagnosis not present

## 2023-06-24 DIAGNOSIS — R7303 Prediabetes: Secondary | ICD-10-CM | POA: Diagnosis not present

## 2023-06-24 DIAGNOSIS — E66811 Obesity, class 1: Secondary | ICD-10-CM | POA: Diagnosis not present

## 2023-06-24 MED ORDER — FLUCONAZOLE 150 MG PO TABS
150.0000 mg | ORAL_TABLET | ORAL | 1 refills | Status: AC
  Filled 2023-06-24: qty 2, 14d supply, fill #0

## 2023-06-24 MED ORDER — AZITHROMYCIN 250 MG PO TABS
ORAL_TABLET | ORAL | 0 refills | Status: AC
  Filled 2023-06-24: qty 6, 5d supply, fill #0

## 2023-06-30 ENCOUNTER — Other Ambulatory Visit (HOSPITAL_COMMUNITY): Payer: Self-pay

## 2023-07-17 ENCOUNTER — Other Ambulatory Visit (HOSPITAL_COMMUNITY): Payer: Self-pay

## 2023-07-17 MED ORDER — VITAMIN D3 1.25 MG (50000 UT) PO CAPS
1.0000 | ORAL_CAPSULE | ORAL | 4 refills | Status: AC
  Filled 2023-07-17: qty 13, 90d supply, fill #0

## 2023-11-09 ENCOUNTER — Other Ambulatory Visit: Payer: Self-pay | Admitting: Internal Medicine

## 2023-11-09 DIAGNOSIS — Z1231 Encounter for screening mammogram for malignant neoplasm of breast: Secondary | ICD-10-CM

## 2023-12-07 ENCOUNTER — Ambulatory Visit
Admission: RE | Admit: 2023-12-07 | Discharge: 2023-12-07 | Disposition: A | Source: Ambulatory Visit | Attending: Internal Medicine | Admitting: Internal Medicine

## 2023-12-07 DIAGNOSIS — Z1231 Encounter for screening mammogram for malignant neoplasm of breast: Secondary | ICD-10-CM
